# Patient Record
Sex: Female | Born: 1970 | Race: Black or African American | Hispanic: No | Marital: Married | Smoking: Never smoker
Health system: Southern US, Community
[De-identification: ages and names within clinical notes are randomized; demographics above are authoritative.]

## PROBLEM LIST (undated history)

## (undated) DIAGNOSIS — Z789 Other specified health status: Secondary | ICD-10-CM

## (undated) DIAGNOSIS — IMO0002 Reserved for concepts with insufficient information to code with codable children: Secondary | ICD-10-CM

## (undated) DIAGNOSIS — Z3480 Encounter for supervision of other normal pregnancy, unspecified trimester: Secondary | ICD-10-CM

## (undated) HISTORY — PX: SPINAL FUSION: SHX223

---
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
## 2010-12-02 LAB — ANTIBODY SCREEN: Antibody Screen: NEGATIVE

## 2010-12-02 LAB — RUBELLA ANTIBODY, IGM: Rubella: IMMUNE

## 2010-12-02 LAB — RPR: RPR: NONREACTIVE

## 2010-12-19 ENCOUNTER — Inpatient Hospital Stay (HOSPITAL_COMMUNITY)
Admission: AD | Admit: 2010-12-19 | Discharge: 2010-12-19 | Disposition: A | Discharge status: Home / Self Care | Payer: Managed Care, Other (non HMO) | Source: Ambulatory Visit | Attending: Obstetrics and Gynecology | Admitting: Obstetrics and Gynecology

## 2010-12-19 ENCOUNTER — Encounter (HOSPITAL_COMMUNITY): Payer: Self-pay

## 2010-12-19 DIAGNOSIS — O34599 Maternal care for other abnormalities of gravid uterus, unspecified trimester: Secondary | ICD-10-CM | POA: Insufficient documentation

## 2010-12-19 HISTORY — DX: Other specified health status: Z78.9

## 2010-12-19 NOTE — ED Provider Notes (Signed)
History     No chief complaint on file.  HPI  Pt isG2P1  [redacted]w[redacted]d pregnant, pt of [facility] with documented viable pregnancy, presents with "tissue coming out of her vagina" that she noticed when she was taking a shower this morning.  Pt denies cramping or bleeding, UTI symptoms, constipation or diarrhea.    Past Medical History  Diagnosis Date  . No pertinent past medical history     Past Surgical History  Procedure Date  . Spinal fusion     for scoliosis when 40 yrs old  . Hip surgery 1976    left. to fix hip dysplasia    No family history on file.  History  Substance Use Topics  . Smoking status: Never Smoker   . Smokeless tobacco: Not on file  . Alcohol Use: No    Allergies: Allergies not on file  No prescriptions prior to admission    Review of Systems  Constitutional: Negative for fever and chills.  Gastrointestinal: Positive for nausea. Negative for abdominal pain.  Genitourinary: Negative for dysuria and urgency.   Physical Exam   Blood pressure 121/77, pulse 75, temperature 99.3 F (37.4 C), resp. rate 16, height 5' 4.5" (1.638 m), weight 148 lb (67.132 kg).  Physical Exam  Constitutional: She is oriented to person, place, and time. She appears well-developed and well-nourished.  Eyes: Pupils are equal, round, and reactive to light.  Neck: Normal range of motion.  Cardiovascular: Normal rate.   Respiratory: Effort normal.  GI: Soft. There is no tenderness.  Genitourinary:       BUS negative; ant lip of cervix protrudes to the introitus; speculum exam reveals long and closed cervix- bimanual deferred; no bleeding or discharge noted  Musculoskeletal: Normal range of motion.  Neurological: She is alert and oriented to person, place, and time.  Skin: Skin is warm and dry.  Psychiatric: She has a normal mood and affect.    MAU Course  Procedures Pelvic exam Dr. Ellyn Hack contacted and recommended pt keep her next OB appointment and discuss  possible pessary with provider at exam Discussed with pt and husband   Assessment and Plan  Pregnancy with uterine descensus F/u with [facility] provider  [facility] 12/19/2010, 5:16 PM

## 2010-12-19 NOTE — Progress Notes (Signed)
Was taking a shower and felt tissue coming out of vagina, no pain, no bleeding.

## 2011-04-06 NOTE — L&D Delivery Note (Signed)
Delivery Note At 9:19 PM a viable female was delivered via Vaginal, Spontaneous Delivery (Presentation:OA ; LOT ).  APGAR: 9, 9; weight 6 lb 2.1 oz (2780 g).   Placenta status: Intact, Spontaneous.  Cord: 3 vessels with the following complications: None.    Anesthesia:  IV pain meds Episiotomy: None Lacerations: 2nd degree;Perineal Suture Repair: 3.0 vicryl rapide Est. Blood Loss (mL): 500  Mom to postpartum.  Baby to nursery-stable.  BOVARD,Crytal Pensinger 06/23/2011, 9:43 PM   B+/Br/?Contra

## 2011-04-21 ENCOUNTER — Other Ambulatory Visit (HOSPITAL_COMMUNITY): Payer: Self-pay | Admitting: Obstetrics and Gynecology

## 2011-04-21 DIAGNOSIS — O2690 Pregnancy related conditions, unspecified, unspecified trimester: Secondary | ICD-10-CM

## 2011-04-27 ENCOUNTER — Ambulatory Visit (HOSPITAL_COMMUNITY): Payer: Managed Care, Other (non HMO)

## 2011-04-30 ENCOUNTER — Encounter (HOSPITAL_COMMUNITY): Payer: Self-pay

## 2011-04-30 ENCOUNTER — Ambulatory Visit (HOSPITAL_COMMUNITY)
Admission: RE | Admit: 2011-04-30 | Discharge: 2011-04-30 | Disposition: A | Discharge status: Home / Self Care | Payer: Managed Care, Other (non HMO) | Source: Ambulatory Visit | Attending: Obstetrics and Gynecology | Admitting: Obstetrics and Gynecology

## 2011-04-30 DIAGNOSIS — Z3689 Encounter for other specified antenatal screening: Secondary | ICD-10-CM | POA: Insufficient documentation

## 2011-04-30 DIAGNOSIS — O2690 Pregnancy related conditions, unspecified, unspecified trimester: Secondary | ICD-10-CM

## 2011-04-30 DIAGNOSIS — O358XX0 Maternal care for other (suspected) fetal abnormality and damage, not applicable or unspecified: Secondary | ICD-10-CM | POA: Insufficient documentation

## 2011-06-03 LAB — STREP B DNA PROBE: GBS: NEGATIVE

## 2011-06-21 ENCOUNTER — Encounter (HOSPITAL_COMMUNITY): Payer: Self-pay | Admitting: *Deleted

## 2011-06-21 ENCOUNTER — Telehealth (HOSPITAL_COMMUNITY): Payer: Self-pay | Admitting: *Deleted

## 2011-06-21 NOTE — Telephone Encounter (Signed)
Preadmission screen  

## 2011-06-23 ENCOUNTER — Encounter (HOSPITAL_COMMUNITY): Payer: Self-pay | Admitting: *Deleted

## 2011-06-23 ENCOUNTER — Other Ambulatory Visit: Payer: Self-pay | Admitting: Obstetrics and Gynecology

## 2011-06-23 ENCOUNTER — Inpatient Hospital Stay (HOSPITAL_COMMUNITY)
Admission: AD | Admit: 2011-06-23 | Discharge: 2011-06-25 | DRG: 775 | Disposition: A | Discharge status: Home / Self Care | Payer: Managed Care, Other (non HMO) | Source: Ambulatory Visit | Attending: Obstetrics and Gynecology | Admitting: Obstetrics and Gynecology

## 2011-06-23 DIAGNOSIS — Z3480 Encounter for supervision of other normal pregnancy, unspecified trimester: Secondary | ICD-10-CM

## 2011-06-23 DIAGNOSIS — IMO0002 Reserved for concepts with insufficient information to code with codable children: Secondary | ICD-10-CM

## 2011-06-23 DIAGNOSIS — O09529 Supervision of elderly multigravida, unspecified trimester: Secondary | ICD-10-CM | POA: Diagnosis present

## 2011-06-23 HISTORY — DX: Encounter for supervision of other normal pregnancy, unspecified trimester: Z34.80

## 2011-06-23 HISTORY — DX: Reserved for concepts with insufficient information to code with codable children: IMO0002

## 2011-06-23 LAB — CBC
HCT: 34.4 % — ABNORMAL LOW (ref 36.0–46.0)
Hemoglobin: 10.5 g/dL — ABNORMAL LOW (ref 12.0–15.0)
MCH: 25.1 pg — ABNORMAL LOW (ref 26.0–34.0)
MCHC: 30.5 g/dL (ref 30.0–36.0)
MCV: 82.3 fL (ref 78.0–100.0)
Platelets: 260 K/uL (ref 150–400)
RBC: 4.18 MIL/uL (ref 3.87–5.11)
RDW: 14.4 % (ref 11.5–15.5)
WBC: 5.5 K/uL (ref 4.0–10.5)

## 2011-06-23 MED ORDER — OXYTOCIN 20 UNITS IN LACTATED RINGERS INFUSION - SIMPLE
125.0000 mL/h | Freq: Once | INTRAVENOUS | Status: AC
  Administered 2011-06-23: 125 mL/h via INTRAVENOUS

## 2011-06-23 MED ORDER — FLEET ENEMA 7-19 GM/118ML RE ENEM: 1.0000 | ENEMA | RECTAL | Status: DC | PRN

## 2011-06-23 MED ORDER — ONDANSETRON HCL 4 MG/2ML IJ SOLN: 4.0000 mg | Freq: Four times a day (QID) | INTRAMUSCULAR | Status: DC | PRN

## 2011-06-23 MED ORDER — CITRIC ACID-SODIUM CITRATE 334-500 MG/5ML PO SOLN: 30.0000 mL | ORAL | Status: DC | PRN

## 2011-06-23 MED ORDER — ACETAMINOPHEN 325 MG PO TABS: 650.0000 mg | ORAL_TABLET | ORAL | Status: DC | PRN

## 2011-06-23 MED ORDER — IBUPROFEN 600 MG PO TABS: 600.0000 mg | ORAL_TABLET | Freq: Four times a day (QID) | ORAL | Status: DC | PRN

## 2011-06-23 MED ORDER — OXYTOCIN BOLUS FROM INFUSION
500.0000 mL | Freq: Once | INTRAVENOUS | Status: DC
  Filled 2011-06-23: qty 500
  Filled 2011-06-23: qty 1000

## 2011-06-23 MED ORDER — IBUPROFEN 600 MG PO TABS
600.0000 mg | ORAL_TABLET | Freq: Four times a day (QID) | ORAL | Status: DC
  Filled 2011-06-23 (×4): qty 1

## 2011-06-23 MED ORDER — LACTATED RINGERS IV SOLN: INTRAVENOUS | Status: DC

## 2011-06-23 MED ORDER — LIDOCAINE HCL (PF) 1 % IJ SOLN
30.0000 mL | INTRAMUSCULAR | Status: DC | PRN
  Administered 2011-06-23: 30 mL via SUBCUTANEOUS
  Filled 2011-06-23: qty 30

## 2011-06-23 MED ORDER — LACTATED RINGERS IV SOLN: 500.0000 mL | INTRAVENOUS | Status: DC | PRN

## 2011-06-23 MED ORDER — OXYCODONE-ACETAMINOPHEN 5-325 MG PO TABS: 1.0000 | ORAL_TABLET | ORAL | Status: DC | PRN

## 2011-06-23 MED ORDER — BUTORPHANOL TARTRATE 2 MG/ML IJ SOLN
1.0000 mg | INTRAMUSCULAR | Status: DC | PRN
  Administered 2011-06-23: 2 mg via INTRAVENOUS
  Filled 2011-06-23: qty 1

## 2011-06-23 NOTE — H&P (Signed)
Carmen Garrett is a 41 y.o. female G2P1001 at 38+ with SROM and cervical change, in ealy labor.  Pregnancy complicated by maternal hip dysplasia, no genetic screening and AMA.  Gbbs negative. Maternal Medical History:  Reason for admission: Reason for admission: rupture of membranes.  Fetal activity: Perceived fetal activity is normal.      OB History    Grav Para Term Preterm Abortions TAB SAB Ect Mult Living   2 1 1  0 0 0 0 0 0 1    G1 SVD 40wk, 6#11 female, G2 present; No abn pap, no STDs  Past Medical History  Diagnosis Date  . No pertinent past medical history   . SROM (spontaneous rupture of membranes) 06/23/2011  . Normal pregnancy, repeat 06/23/2011  h/o hip dysplasia  Past Surgical History  Procedure Date  . Spinal fusion     for scoliosis when 41 yrs old  . Hip surgery 1976    left. to fix hip dysplasia   Family History: family history includes Cancer in her father and maternal aunt; Diabetes in her sisters; Endometriosis in her sisters; and Hypertension in her mother. Social History:  reports that she has never smoked. She has never used smokeless tobacco. She reports that she does not drink alcohol or use illicit drugs.married  Meds none All NKDA  Review of Systems  Constitutional: Negative.   HENT: Negative.   Eyes: Negative.   Respiratory: Negative.   Cardiovascular: Negative.   Gastrointestinal: Negative.   Genitourinary: Negative.   Musculoskeletal: Negative.   Skin: Negative.   Neurological: Negative.   Psychiatric/Behavioral: Negative.     Dilation: 5 Effacement (%): 90 Exam by:: a. white rn Blood pressure 102/67, pulse 87, temperature 98.8 F (37.1 C), temperature source Oral, resp. rate 20, height 5\' 5"  (1.651 m), weight 78.019 kg (172 lb), last menstrual period 09/24/2010. Maternal Exam:  Uterine Assessment: Contraction strength is moderate.  Abdomen: Fundal height is appropriate for gestation.   Estimated fetal weight is 7#.   Fetal  presentation: vertex     Physical Exam  Constitutional: She is oriented to person, place, and time. She appears well-developed and well-nourished.  HENT:  Head: Normocephalic and atraumatic.  Neck: Normal range of motion. Neck supple. No thyromegaly present.  Cardiovascular: Normal rate and regular rhythm.   Respiratory: Effort normal and breath sounds normal.  GI: Soft. Bowel sounds are normal. There is no tenderness.  Musculoskeletal: Normal range of motion.  Neurological: She is alert and oriented to person, place, and time.  Skin: Skin is warm and dry.  Psychiatric: She has a normal mood and affect. Her behavior is normal.    Prenatal labs: ABO, Rh: B/Positive/-- (08/29 0000) Antibody: Negative (08/29 0000) Rubella: Immune (08/29 0000) RPR: Nonreactive (08/29 0000)  HBsAg: Negative (08/29 0000)  HIV: Non-reactive (08/29 0000)  GBS: Negative (02/28 0000)  Hgb 12.3/ Pap WNL/ Plt 253K/ Hgb electro WNL/ GC neg/Chl neg/ CF neg/ AFP and first tri screen declined/glucola 66/  EDC 9/28, nl early IUP EDC cwd, nl anat, post plac, female  Assessment/Plan: 40yo G2P1001 at 38+ with SROM Expect SVD Pitocin prn Epidural prn and IV med prn   BOVARD,Charna Neeb 06/23/2011, 8:03 PM

## 2011-06-24 LAB — CBC
HCT: 29.4 % — ABNORMAL LOW (ref 36.0–46.0)
Hemoglobin: 9.2 g/dL — ABNORMAL LOW (ref 12.0–15.0)
MCV: 82.1 fL (ref 78.0–100.0)
WBC: 7.8 10*3/uL (ref 4.0–10.5)

## 2011-06-24 LAB — RPR: RPR Ser Ql: NONREACTIVE

## 2011-06-24 MED ORDER — DIBUCAINE 1 % RE OINT: 1.0000 "application " | TOPICAL_OINTMENT | RECTAL | Status: DC | PRN

## 2011-06-24 MED ORDER — ONDANSETRON HCL 4 MG PO TABS: 4.0000 mg | ORAL_TABLET | ORAL | Status: DC | PRN

## 2011-06-24 MED ORDER — SIMETHICONE 80 MG PO CHEW: 80.0000 mg | CHEWABLE_TABLET | ORAL | Status: DC | PRN

## 2011-06-24 MED ORDER — BENZOCAINE-MENTHOL 20-0.5 % EX AERO
INHALATION_SPRAY | CUTANEOUS | Status: AC
Start: 2011-06-24 — End: 2011-06-24
  Administered 2011-06-24: 05:00:00
  Filled 2011-06-24: qty 56

## 2011-06-24 MED ORDER — ONDANSETRON HCL 4 MG/2ML IJ SOLN: 4.0000 mg | INTRAMUSCULAR | Status: DC | PRN

## 2011-06-24 MED ORDER — PRENATAL MULTIVITAMIN CH
1.0000 | ORAL_TABLET | Freq: Every day | ORAL | Status: DC
  Administered 2011-06-24: 1 via ORAL
  Filled 2011-06-24: qty 1

## 2011-06-24 MED ORDER — OXYCODONE-ACETAMINOPHEN 5-325 MG PO TABS
1.0000 | ORAL_TABLET | ORAL | Status: DC | PRN
Start: 2011-06-24 — End: 2011-06-25

## 2011-06-24 MED ORDER — LACTATED RINGERS IV SOLN: INTRAVENOUS | Status: DC

## 2011-06-24 MED ORDER — SENNOSIDES-DOCUSATE SODIUM 8.6-50 MG PO TABS: 2.0000 | ORAL_TABLET | Freq: Every day | ORAL | Status: DC

## 2011-06-24 MED ORDER — TETANUS-DIPHTH-ACELL PERTUSSIS 5-2.5-18.5 LF-MCG/0.5 IM SUSP: 0.5000 mL | Freq: Once | INTRAMUSCULAR | Status: DC

## 2011-06-24 MED ORDER — ZOLPIDEM TARTRATE 5 MG PO TABS: 5.0000 mg | ORAL_TABLET | Freq: Every evening | ORAL | Status: DC | PRN

## 2011-06-24 MED ORDER — BENZOCAINE-MENTHOL 20-0.5 % EX AERO: 1.0000 "application " | INHALATION_SPRAY | CUTANEOUS | Status: DC | PRN

## 2011-06-24 MED ORDER — DIPHENHYDRAMINE HCL 25 MG PO CAPS: 25.0000 mg | ORAL_CAPSULE | Freq: Four times a day (QID) | ORAL | Status: DC | PRN

## 2011-06-24 MED ORDER — LANOLIN HYDROUS EX OINT: TOPICAL_OINTMENT | CUTANEOUS | Status: DC | PRN

## 2011-06-24 MED ORDER — WITCH HAZEL-GLYCERIN EX PADS: 1.0000 "application " | MEDICATED_PAD | CUTANEOUS | Status: DC | PRN

## 2011-06-24 NOTE — Progress Notes (Signed)
Post Partum Day 1 Subjective: no complaints, voiding, tolerating PO and pain controlled, nl lochia  Objective: Blood pressure 98/66, pulse 85, temperature 98.4 F (36.9 C), temperature source Oral, resp. rate 18, height 5\' 5"  (1.651 m), weight 78.019 kg (172 lb), last menstrual period 09/24/2010, SpO2 95.00%, unknown if currently breastfeeding.  Physical Exam:  General: alert and no distress Lochia: appropriate Uterine Fundus: firm   Assessment/Plan: Plan for discharge tomorrow, Breastfeeding and Lactation consult   LOS: 1 day   BOVARD,Darrelle Wiberg 06/24/2011, 8:37 AM

## 2011-06-25 MED ORDER — IBUPROFEN 800 MG PO TABS: 800.0000 mg | ORAL_TABLET | Freq: Four times a day (QID) | ORAL | Status: AC

## 2011-06-25 NOTE — Progress Notes (Signed)
Post Partum Day 2 Subjective: no complaints, up ad lib, tolerating PO, + flatus and nl lochia, pain controlled  Objective: Blood pressure 91/57, pulse 71, temperature 98.5 F (36.9 C), temperature source Oral, resp. rate 18, height 5\' 5"  (1.651 m), weight 78.019 kg (172 lb), last menstrual period 09/24/2010, SpO2 98.00%, unknown if currently breastfeeding.  Physical Exam:  General: alert and no distress Lochia: appropriate Uterine Fundus: firm  Basename 06/24/11 0615 06/23/11 1715  HGB 9.2* 10.5*  HCT 29.4* 34.4*    Assessment/Plan: Discharge home, Breastfeeding and Lactation consult d/c with Motrin f/u 6 wks   LOS: 2 days   BOVARD,Amela Handley 06/25/2011, 7:54 AM

## 2011-06-25 NOTE — Discharge Summary (Signed)
Obstetric Discharge Summary Reason for Admission: onset of labor and rupture of membranes Prenatal Procedures: none Intrapartum Procedures: spontaneous vaginal delivery Postpartum Procedures: none Complications-Operative and Postpartum: 2nd degree perineal laceration Hemoglobin  Date Value Range Status  06/24/2011 9.2* 12.0-15.0 (g/dL) Final     HCT  Date Value Range Status  06/24/2011 29.4* 36.0-46.0 (%) Final    Physical Exam:  General: alert and no distress Lochia: appropriate Uterine Fundus: firm  Discharge Diagnoses: Term Pregnancy-delivered  Discharge Information: Date: 06/25/2011 Activity: pelvic rest Diet: routine Medications: Ibuprofen Condition: stable Instructions: refer to practice specific booklet Discharge to: home Follow-up Information    Follow up with BOVARD,Cheyrl Buley, MD. Schedule an appointment as soon as possible for a visit in 6 weeks.   Contact information:   [address] [facility] [address] [place] [address] [phone]          Newborn Data: Live born female  Birth Weight: 6 lb 2.1 oz (2780 g) APGAR: 9, 9  Home with mother.  BOVARD,Johnthomas Lader 06/25/2011, 7:58 AM

## 2011-07-01 ENCOUNTER — Inpatient Hospital Stay (HOSPITAL_COMMUNITY): Admission: RE | Admit: 2011-07-01 | Payer: Managed Care, Other (non HMO) | Source: Ambulatory Visit

## 2012-11-15 IMAGING — US US OB DETAIL+14 WK
1 series · 14 of 28 positions shown · non-contrast
Comparison: none

[Series 1: us ob detail+14 wk · 0.21mm/px · 73 acquisitions, 14 frames shown]
[im 3/73]
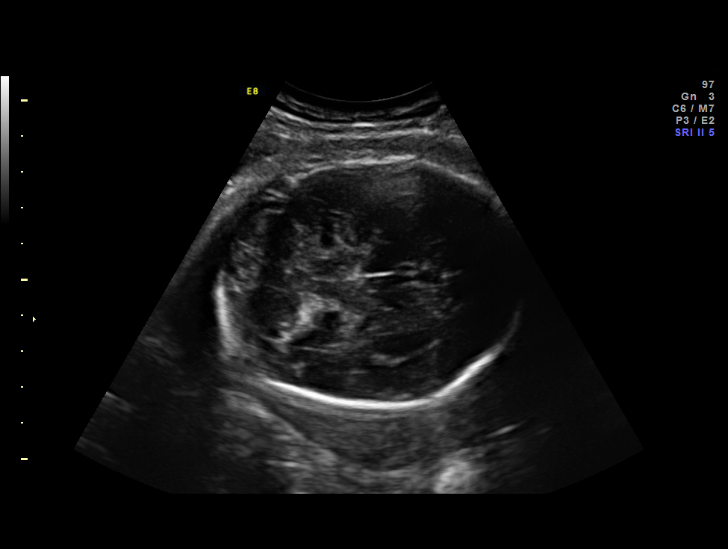
[im 9/73]
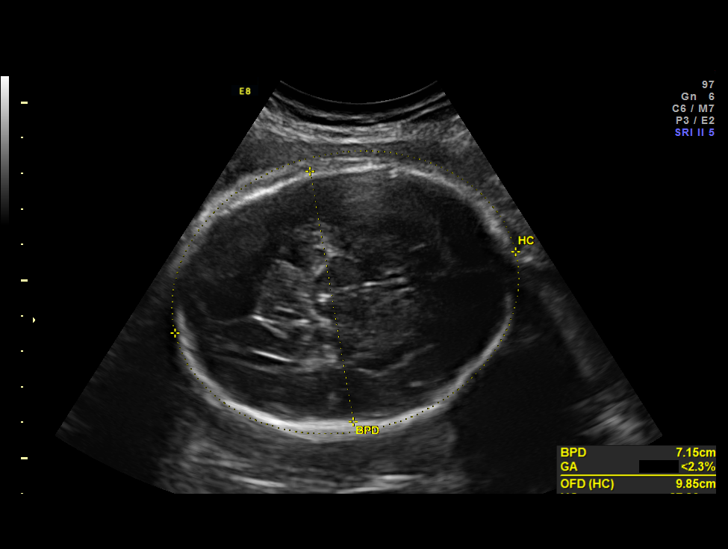
[im 14/73]
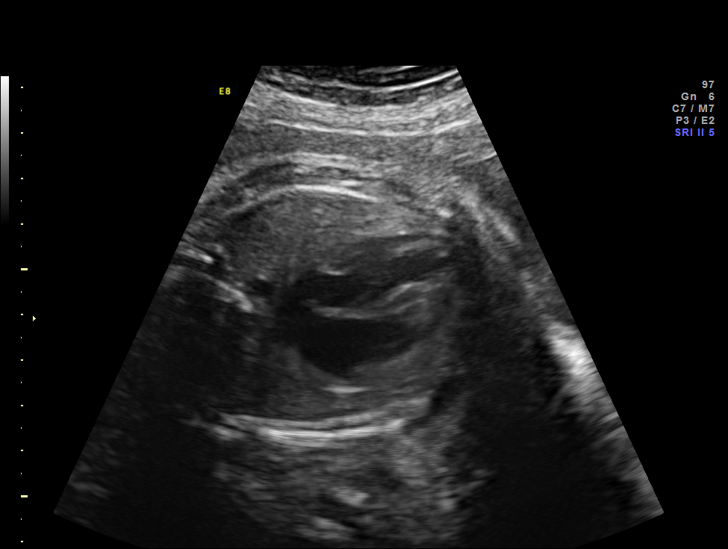
[im 19/73]
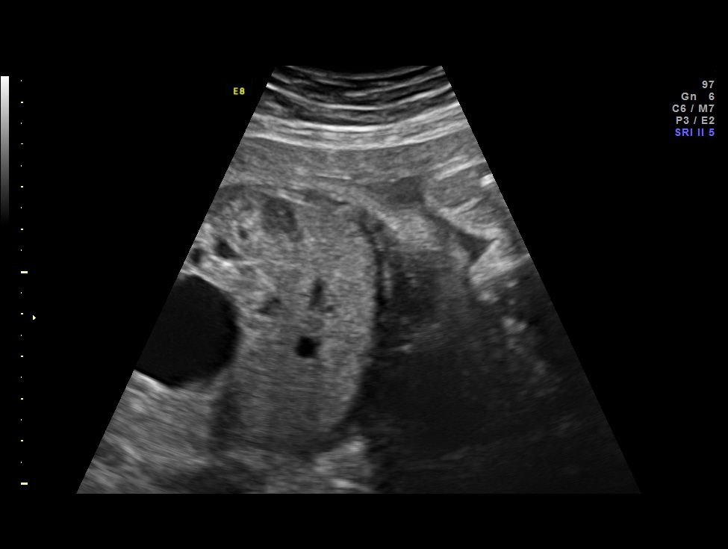
[im 25/73]
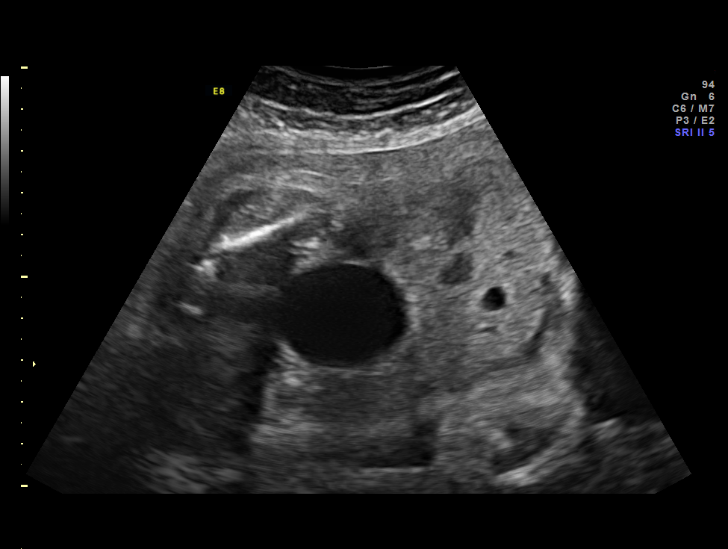
[im 30/73]
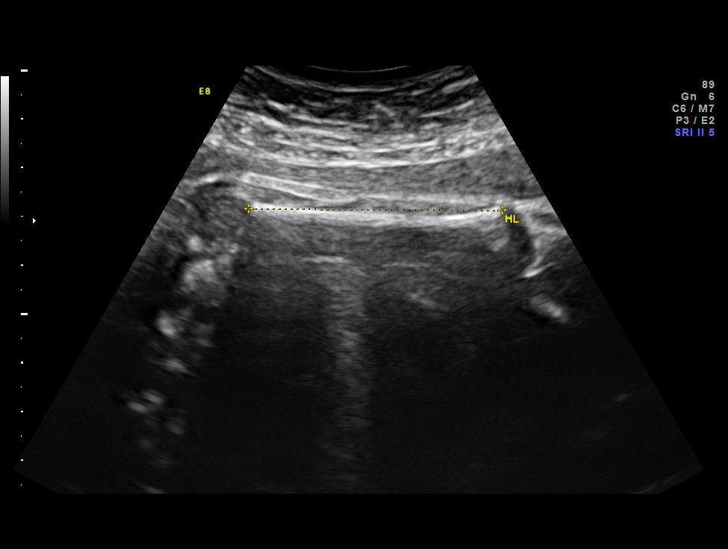
[im 35/73]
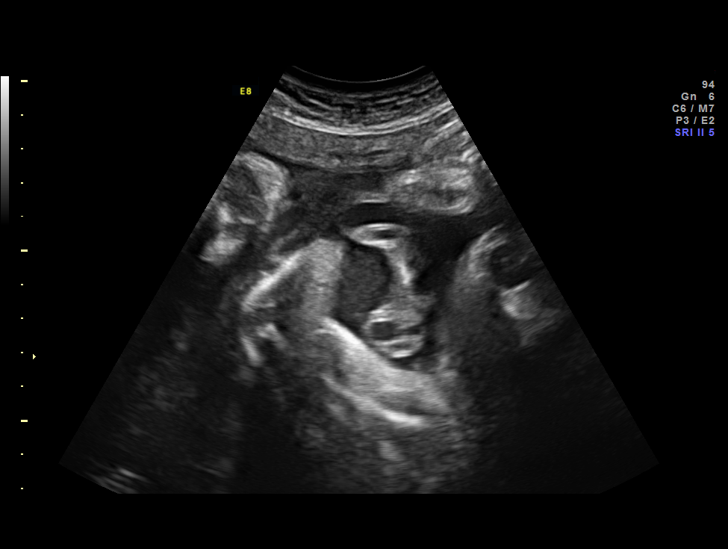
[im 41/73]
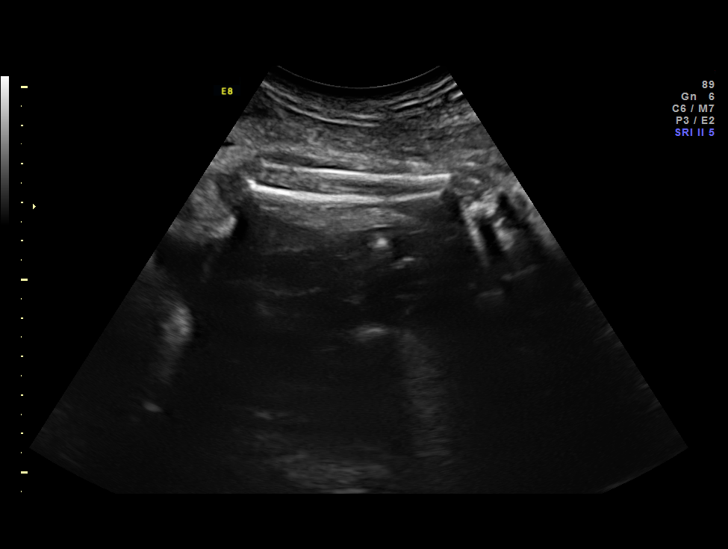
[im 46/73]
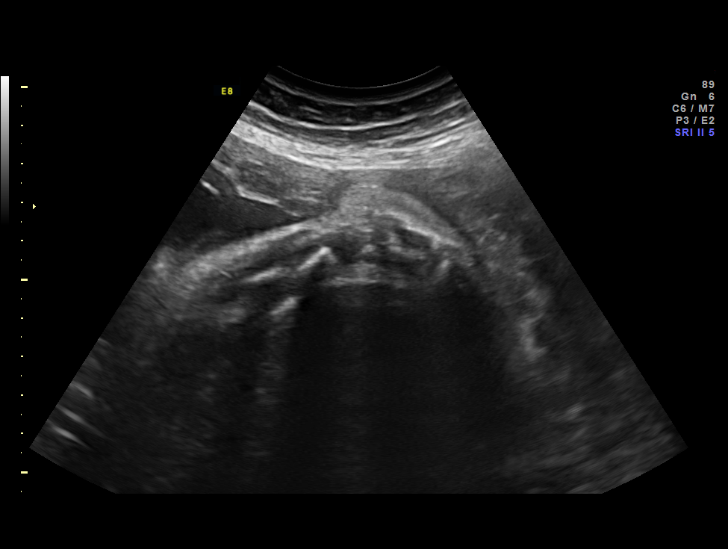
[im 51/73]
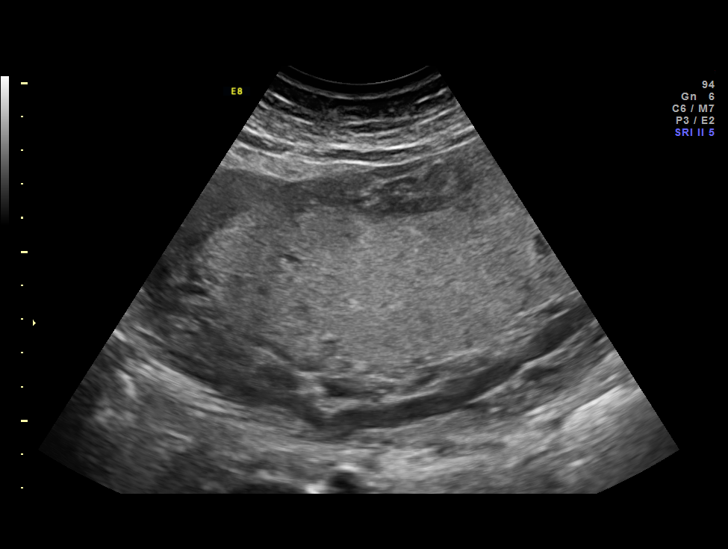
[im 57/73]
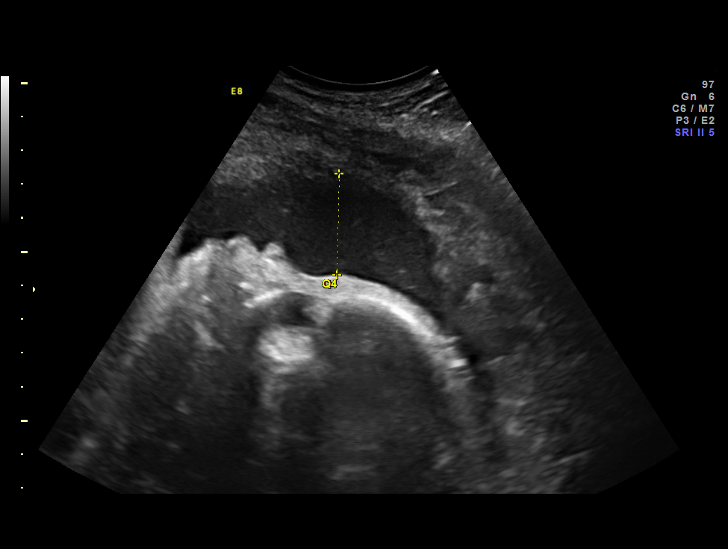
[im 62/73]
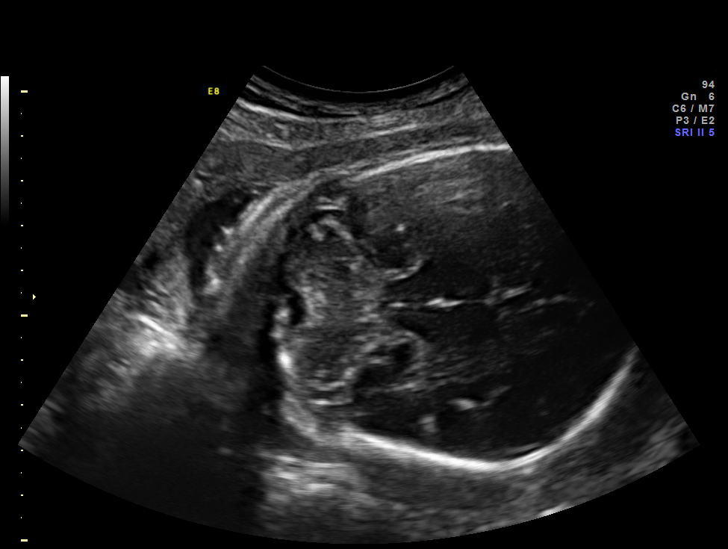
[im 67/73]
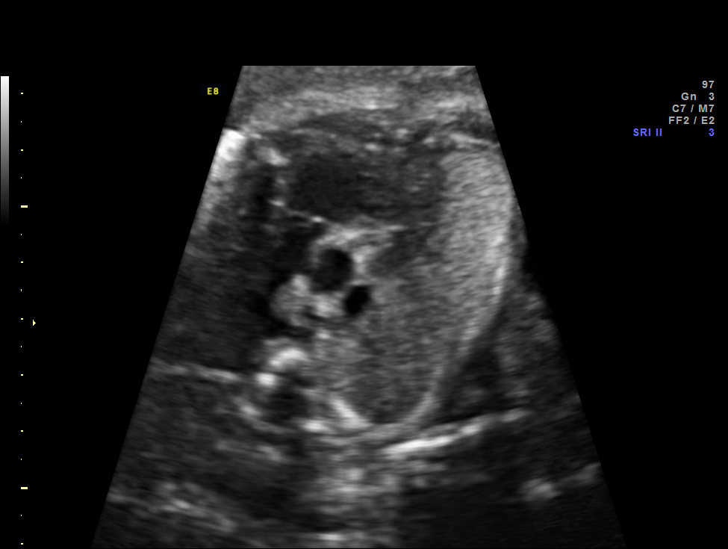
[im 73/73]
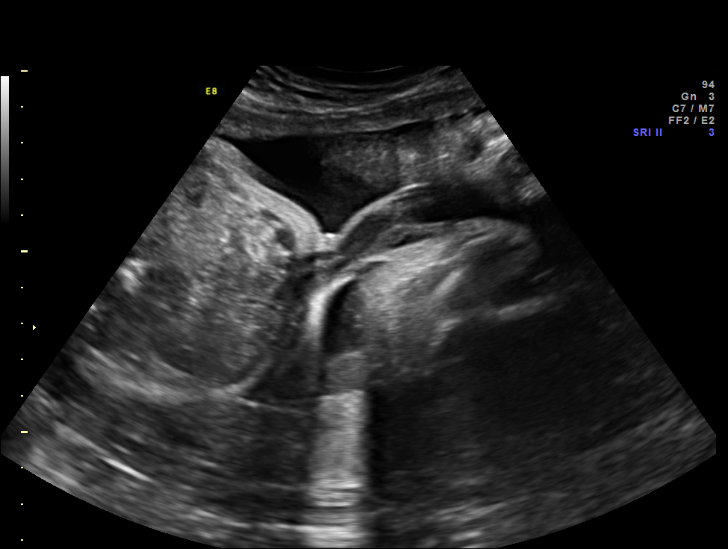

[14 of 28 positions shown; findings below may reference images not displayed]

Canned report from images found in remote index.

Refer to host system for actual result text.

## 2014-02-04 ENCOUNTER — Encounter (HOSPITAL_COMMUNITY): Payer: Self-pay | Admitting: *Deleted

## 2021-03-29 ENCOUNTER — Emergency Department (HOSPITAL_BASED_OUTPATIENT_CLINIC_OR_DEPARTMENT_OTHER)
Admission: EM | Admit: 2021-03-29 | Discharge: 2021-03-29 | Disposition: A | Discharge status: Home / Self Care | Payer: 59 | Attending: Emergency Medicine | Admitting: Emergency Medicine

## 2021-03-29 ENCOUNTER — Encounter (HOSPITAL_BASED_OUTPATIENT_CLINIC_OR_DEPARTMENT_OTHER): Payer: Self-pay

## 2021-03-29 ENCOUNTER — Other Ambulatory Visit: Payer: Self-pay

## 2021-03-29 DIAGNOSIS — S0592XA Unspecified injury of left eye and orbit, initial encounter: Secondary | ICD-10-CM | POA: Diagnosis not present

## 2021-03-29 DIAGNOSIS — W228XXA Striking against or struck by other objects, initial encounter: Secondary | ICD-10-CM | POA: Insufficient documentation

## 2021-03-29 MED ORDER — TETRACAINE HCL 0.5 % OP SOLN
2.0000 [drp] | Freq: Once | OPHTHALMIC | Status: AC
  Administered 2021-03-29: 14:00:00 2 [drp] via OPHTHALMIC
  Filled 2021-03-29: qty 4

## 2021-03-29 MED ORDER — FLUORESCEIN SODIUM 1 MG OP STRP
1.0000 | ORAL_STRIP | Freq: Once | OPHTHALMIC | Status: AC
  Administered 2021-03-29: 14:00:00 1 via OPHTHALMIC
  Filled 2021-03-29: qty 1

## 2021-03-29 MED ORDER — TETRACAINE HCL 0.5 % OP SOLN: 2.0000 [drp] | Freq: Once | OPHTHALMIC | Status: DC

## 2021-03-29 MED ORDER — FLUORESCEIN SODIUM 1 MG OP STRP: 1.0000 | ORAL_STRIP | Freq: Once | OPHTHALMIC | Status: DC

## 2021-03-29 NOTE — ED Triage Notes (Signed)
Pt states that she was opening champagne bottle and had the cork strike her in the L eye. Pt with some swelling to eye noted and states that she has had some vision changes where "everything seems hazy". NAD during triage.

## 2021-03-29 NOTE — Discharge Instructions (Addendum)
You were seen in the emergency department today for an eye injury.  As we discussed I used a lamp to look at your cornea, and I do not see any defects.  You can use lubrication eyedrops at home as needed.  Try to refrain from touching or itching your eye, but if you are going to make sure that you are washing your hands really well.  Continue to monitor how you're doing and return to the ER for new or worsening symptoms such as persistent changes in your vision.   It has been a pleasure seeing and caring for you today and I hope you start feeling better soon!

## 2021-03-29 NOTE — ED Provider Notes (Signed)
[facility] EMERGENCY DEPT Provider Note   CSN: [id number] Arrival date & time: 03/29/21  1123     History Chief Complaint  Patient presents with   Eye Injury    Carmen Garrett is a 50 y.o. female presents emergency department complaining of a left eye injury.  Patient states that she was opening a champagne bottle, and the cork struck her in her left eye.  She is complaining of swelling of the eye, and states that she has had some vision changes.  She describes it as "everything seems hazy".   Eye Injury      Past Medical History:  Diagnosis Date   No pertinent past medical history    Normal pregnancy, repeat 06/23/2011   SROM (spontaneous rupture of membranes) 06/23/2011    Patient Active Problem List   Diagnosis Date Noted   SVD (spontaneous vaginal delivery) 06/23/2011    Past Surgical History:  Procedure Laterality Date   HIP SURGERY  1976   left. to fix hip dysplasia   SPINAL FUSION     for scoliosis when 50 yrs old     OB History     Gravida  2   Para  2   Term  2   Preterm  0   AB  0   Living  2      SAB  0   IAB  0   Ectopic  0   Multiple  0   Live Births  2           Family History  Problem Relation Age of Onset   Hypertension Mother    Cancer Father        lung   Endometriosis Sister    Cancer Maternal Aunt        breast   Diabetes Sister    Diabetes Sister    Endometriosis Sister     Social History   Tobacco Use   Smoking status: Never   Smokeless tobacco: Never  Substance Use Topics   Alcohol use: No   Drug use: No    Home Medications Prior to Admission medications   Not on File    Allergies    Patient has no known allergies.  Review of Systems   Review of Systems  Eyes:  Positive for pain, redness and visual disturbance.  All other systems reviewed and are negative.  Physical Exam Updated Vital Signs BP 128/68 (BP Location: Left Arm)    Pulse 68    Temp 98.5 F (36.9 C) (Oral)     Resp 16    Ht 5\' 5"  (1.651 m)    Wt 74.8 kg    SpO2 100%    BMI 27.46 kg/m   Physical Exam Vitals and nursing note reviewed.  Constitutional:      Appearance: Normal appearance.  HENT:     Head: Normocephalic and atraumatic.  Eyes:     General:        Right eye: No foreign body.        Left eye: No foreign body.     Conjunctiva/sclera:     Right eye: Right conjunctiva is not injected. No chemosis or exudate.    Left eye: Left conjunctiva is injected. No chemosis or exudate.    Pupils: Pupils are equal, round, and reactive to light.     Left eye: No corneal abrasion or fluorescein uptake.     Comments: On Woods lamp examination there is no fluorescein uptake in the  left cornea to suggest corneal abrasion  Pulmonary:     Effort: Pulmonary effort is normal. No respiratory distress.  Skin:    General: Skin is warm and dry.  Neurological:     Mental Status: She is alert.  Psychiatric:        Mood and Affect: Mood normal.        Behavior: Behavior normal.    ED Results / Procedures / Treatments   Labs (all labs ordered are listed, but only abnormal results are displayed) Labs Reviewed - No data to display  EKG None  Radiology No results found.  Procedures Procedures   Medications Ordered in ED Medications  tetracaine (PONTOCAINE) 0.5 % ophthalmic solution 2 drop (2 drops Right Eye Given 03/29/21 1358)  fluorescein ophthalmic strip 1 strip (1 strip Right Eye Given 03/29/21 1357)    ED Course  I have reviewed the triage vital signs and the nursing notes.  Pertinent labs & imaging results that were available during my care of the patient were reviewed by me and considered in my medical decision making (see chart for details).    MDM Rules/Calculators/A&P                          Patient is an otherwise healthy 49 year old female presents the emergency department with a left eye injury occurring this morning.  Patient states that she was opening a bottle of champagne,  and the cork struck her directly in the left eye.  Patient initially had vision changes upon arrival, and she described "everything seemed hazy".  Visual acuity testing today appears to be at baseline, with patient's expired prescription of glasses that she is currently wearing on my exam.  She states that her vision has improved upon observation in the emergency room.  Woods lamp examination of the left eye showed no fluorescein uptake to suggest corneal abrasion.  I have low concern for other ophthalmologic emergencies.  She is not requiring admission or inpatient treatment, and she stable for discharge.  Discussed return precautions, patient agreeable to plan.  Final Clinical Impression(s) / ED Diagnoses Final diagnoses:  Left eye injury, initial encounter    Rx / DC Orders ED Discharge Orders     None      Portions of this report may have been transcribed using voice recognition software. Every effort was made to ensure accuracy; however, inadvertent computerized transcription errors may be present.    Estill Cotta 03/29/21 1427    Regan Lemming, MD 03/29/21 2027
